# Patient Record
Sex: Female | Born: 1955 | Race: White | Hispanic: No | Smoking: Never smoker
Health system: Southern US, Community
[De-identification: ages and names within clinical notes are randomized; demographics above are authoritative.]

## PROBLEM LIST (undated history)

## (undated) DIAGNOSIS — F32A Depression, unspecified: Secondary | ICD-10-CM

## (undated) DIAGNOSIS — T7840XA Allergy, unspecified, initial encounter: Secondary | ICD-10-CM

## (undated) DIAGNOSIS — F329 Major depressive disorder, single episode, unspecified: Secondary | ICD-10-CM

## (undated) HISTORY — DX: Major depressive disorder, single episode, unspecified: F32.9

## (undated) HISTORY — DX: Allergy, unspecified, initial encounter: T78.40XA

## (undated) HISTORY — DX: Depression, unspecified: F32.A

---

## 2011-03-03 ENCOUNTER — Institutional Professional Consult (permissible substitution): Payer: Self-pay | Admitting: Internal Medicine

## 2012-10-10 ENCOUNTER — Other Ambulatory Visit: Payer: Self-pay | Admitting: Specialist

## 2012-10-16 ENCOUNTER — Ambulatory Visit
Admission: RE | Admit: 2012-10-16 | Discharge: 2012-10-16 | Disposition: A | Discharge status: Home / Self Care | Payer: BC Managed Care – PPO | Source: Ambulatory Visit | Attending: Specialist | Admitting: Specialist

## 2012-10-16 MED ORDER — GADOBENATE DIMEGLUMINE 529 MG/ML IV SOLN
18.0000 mL | Freq: Once | INTRAVENOUS | Status: AC | PRN
  Administered 2012-10-16: 18 mL via INTRAVENOUS

## 2013-04-09 ENCOUNTER — Ambulatory Visit (INDEPENDENT_AMBULATORY_CARE_PROVIDER_SITE_OTHER): Payer: BC Managed Care – PPO | Admitting: Emergency Medicine

## 2013-04-09 VITALS — BP 114/74 | HR 92 | Temp 98.5°F | Resp 18 | Ht 63.75 in | Wt 209.0 lb

## 2013-04-09 DIAGNOSIS — R42 Dizziness and giddiness: Secondary | ICD-10-CM

## 2013-04-09 DIAGNOSIS — H8110 Benign paroxysmal vertigo, unspecified ear: Secondary | ICD-10-CM

## 2013-04-09 LAB — POCT CBC
HCT, POC: 38.2 % (ref 37.7–47.9)
Hemoglobin: 12.4 g/dL (ref 12.2–16.2)
MCH, POC: 29.4 pg (ref 27–31.2)
MPV: 7.7 fL (ref 0–99.8)
POC MID %: 9.1 %M (ref 0–12)
RBC: 4.22 M/uL (ref 4.04–5.48)
WBC: 8.4 10*3/uL (ref 4.6–10.2)

## 2013-04-09 LAB — GLUCOSE, POCT (MANUAL RESULT ENTRY): POC Glucose: 88 mg/dl (ref 70–99)

## 2013-04-09 MED ORDER — DIAZEPAM 2 MG PO TABS: ORAL_TABLET | ORAL | Status: AC

## 2013-04-09 NOTE — Patient Instructions (Signed)
Vertigo Vertigo means you feel like you or your surroundings are moving when they are not. Vertigo can be dangerous if it occurs when you are at work, driving, or performing difficult activities.  CAUSES  Vertigo occurs when there is a conflict of signals sent to your brain from the visual and sensory systems in your body. There are many different causes of vertigo, including:  Infections, especially in the inner ear.  A bad reaction to a drug or misuse of alcohol and medicines.  Withdrawal from drugs or alcohol.  Rapidly changing positions, such as lying down or rolling over in bed.  A migraine headache.  Decreased blood flow to the brain.  Increased pressure in the brain from a head injury, infection, tumor, or bleeding. SYMPTOMS  You may feel as though the world is spinning around or you are falling to the ground. Because your balance is upset, vertigo can cause nausea and vomiting. You may have involuntary eye movements (nystagmus). DIAGNOSIS  Vertigo is usually diagnosed by physical exam. If the cause of your vertigo is unknown, your caregiver may perform imaging tests, such as an MRI scan (magnetic resonance imaging). TREATMENT  Most cases of vertigo resolve on their own, without treatment. Depending on the cause, your caregiver may prescribe certain medicines. If your vertigo is related to body position issues, your caregiver may recommend movements or procedures to correct the problem. In rare cases, if your vertigo is caused by certain inner ear problems, you may need surgery. HOME CARE INSTRUCTIONS   Follow your caregiver's instructions.  Avoid driving.  Avoid operating heavy machinery.  Avoid performing any tasks that would be dangerous to you or others during a vertigo episode.  Tell your caregiver if you notice that certain medicines seem to be causing your vertigo. Some of the medicines used to treat vertigo episodes can actually make them worse in some people. SEEK  IMMEDIATE MEDICAL CARE IF:   Your medicines do not relieve your vertigo or are making it worse.  You develop problems with talking, walking, weakness, or using your arms, hands, or legs.  You develop severe headaches.  Your nausea or vomiting continues or gets worse.  You develop visual changes.  A family member notices behavioral changes.  Your condition gets worse. MAKE SURE YOU:  Understand these instructions.  Will watch your condition.  Will get help right away if you are not doing well or get worse. Document Released: 06/14/2005 Document Revised: 11/27/2011 Document Reviewed: 03/23/2011 [employer] Patient Information 2014 [employer]  

## 2013-04-09 NOTE — Progress Notes (Signed)
  Subjective:    Patient ID: Ana Roy, female    DOB: 06/28/1956, 57 y.o.   MRN: [id number]  HPI patient states she was doing well until about a week ago when she started to have a sensation of motion in her head. She occasionally pitches to one side. She's had a vague nausea that has been persistent. She felt better for a couple of days and now her symptoms have returned. She denies tinnitus. She denies hearing loss. Of note she suffered from severe headaches earlier in the year and had an MRI MRA which showed minimal white matter changes.    Review of Systems     Objective:   Physical Exam patient is alert and cooperative and in no distress. Pupils are equal and reactive to light. Neck is supple. Chest is clear to both auscultation and percussion. Heart is a regular rate without murmurs. Carotids have no bruits. TMs are clear bilaterally Weber testing revealed symmetrical hearing Rinne testing is normal  Results for orders placed in visit on 04/09/13  POCT CBC      Result Value Range   WBC 8.4  4.6 - 10.2 K/uL   Lymph, poc 2.5  0.6 - 3.4   POC LYMPH PERCENT 29.9  10 - 50 %L   MID (cbc) 0.8  0 - 0.9   POC MID % 9.1  0 - 12 %M   POC Granulocyte 5.1  2 - 6.9   Granulocyte percent 61.0  37 - 80 %G   RBC 4.22  4.04 - 5.48 M/uL   Hemoglobin 12.4  12.2 - 16.2 g/dL   HCT, POC 57.8  46.9 - 47.9 %   MCV 90.6  80 - 97 fL   MCH, POC 29.4  27 - 31.2 pg   MCHC 32.5  31.8 - 35.4 g/dL   RDW, POC 62.9     Platelet Count, POC 294  142 - 424 K/uL   MPV 7.7  0 - 99.8 fL  GLUCOSE, POCT (MANUAL RESULT ENTRY)      Result Value Range   POC Glucose 88  70 - 99 mg/dl        Assessment & Plan:  We'll treat as benign positional vertigo. We'll try Valium recheck in 3-4 days if continued symptoms. Referral made to Dr. Dorma Russell.

## 2014-03-02 ENCOUNTER — Ambulatory Visit (INDEPENDENT_AMBULATORY_CARE_PROVIDER_SITE_OTHER): Payer: BC Managed Care – PPO | Admitting: Licensed Clinical Social Worker

## 2014-03-02 DIAGNOSIS — F4322 Adjustment disorder with anxiety: Secondary | ICD-10-CM

## 2014-03-09 ENCOUNTER — Ambulatory Visit (INDEPENDENT_AMBULATORY_CARE_PROVIDER_SITE_OTHER): Payer: BC Managed Care – PPO | Admitting: Licensed Clinical Social Worker

## 2014-03-09 DIAGNOSIS — F4322 Adjustment disorder with anxiety: Secondary | ICD-10-CM

## 2014-03-16 ENCOUNTER — Ambulatory Visit (INDEPENDENT_AMBULATORY_CARE_PROVIDER_SITE_OTHER): Payer: BC Managed Care – PPO | Admitting: Licensed Clinical Social Worker

## 2014-03-16 DIAGNOSIS — F4322 Adjustment disorder with anxiety: Secondary | ICD-10-CM

## 2014-12-16 ENCOUNTER — Ambulatory Visit (INDEPENDENT_AMBULATORY_CARE_PROVIDER_SITE_OTHER): Payer: BC Managed Care – PPO | Admitting: Licensed Clinical Social Worker

## 2014-12-16 DIAGNOSIS — F4322 Adjustment disorder with anxiety: Secondary | ICD-10-CM | POA: Diagnosis not present

## 2015-03-31 ENCOUNTER — Ambulatory Visit (INDEPENDENT_AMBULATORY_CARE_PROVIDER_SITE_OTHER): Payer: BC Managed Care – PPO | Admitting: Licensed Clinical Social Worker

## 2015-03-31 DIAGNOSIS — F4322 Adjustment disorder with anxiety: Secondary | ICD-10-CM | POA: Diagnosis not present

## 2015-05-21 ENCOUNTER — Ambulatory Visit: Payer: BC Managed Care – PPO | Admitting: Licensed Clinical Social Worker

## 2015-05-31 ENCOUNTER — Ambulatory Visit (INDEPENDENT_AMBULATORY_CARE_PROVIDER_SITE_OTHER): Payer: BC Managed Care – PPO | Admitting: Licensed Clinical Social Worker

## 2015-05-31 DIAGNOSIS — F4322 Adjustment disorder with anxiety: Secondary | ICD-10-CM

## 2015-12-06 ENCOUNTER — Telehealth: Payer: Self-pay | Admitting: Internal Medicine

## 2015-12-07 NOTE — Telephone Encounter (Signed)
I am slowing down and unable to help her with this problem now. Suggest she see one of the other allergy practices in town.

## 2015-12-07 NOTE — Telephone Encounter (Signed)
Spoke with the pt and notified of recs per CDY  I gave her [facility] number  Nothing further needed

## 2015-12-07 NOTE — Telephone Encounter (Signed)
CY - please advise. Thanks! 

## 2016-12-06 ENCOUNTER — Other Ambulatory Visit: Payer: Self-pay | Admitting: Family Medicine

## 2016-12-06 ENCOUNTER — Other Ambulatory Visit (HOSPITAL_COMMUNITY)
Admission: RE | Admit: 2016-12-06 | Discharge: 2016-12-06 | Disposition: A | Discharge status: Home / Self Care | Payer: BC Managed Care – PPO | Source: Ambulatory Visit | Attending: Family Medicine | Admitting: Family Medicine

## 2016-12-06 DIAGNOSIS — Z124 Encounter for screening for malignant neoplasm of cervix: Secondary | ICD-10-CM | POA: Insufficient documentation

## 2016-12-06 DIAGNOSIS — Z1151 Encounter for screening for human papillomavirus (HPV): Secondary | ICD-10-CM | POA: Diagnosis present

## 2016-12-07 ENCOUNTER — Other Ambulatory Visit: Payer: Self-pay | Admitting: Family Medicine

## 2016-12-07 DIAGNOSIS — Z1231 Encounter for screening mammogram for malignant neoplasm of breast: Secondary | ICD-10-CM

## 2016-12-08 LAB — CYTOLOGY - PAP
DIAGNOSIS: NEGATIVE
HPV: NOT DETECTED

## 2017-01-03 ENCOUNTER — Ambulatory Visit
Admission: RE | Admit: 2017-01-03 | Discharge: 2017-01-03 | Disposition: A | Discharge status: Home / Self Care | Payer: BC Managed Care – PPO | Source: Ambulatory Visit | Attending: Family Medicine | Admitting: Family Medicine

## 2017-01-03 DIAGNOSIS — Z1231 Encounter for screening mammogram for malignant neoplasm of breast: Secondary | ICD-10-CM

## 2019-11-10 ENCOUNTER — Other Ambulatory Visit: Payer: Self-pay | Admitting: Family Medicine

## 2019-11-10 DIAGNOSIS — M8580 Other specified disorders of bone density and structure, unspecified site: Secondary | ICD-10-CM

## 2019-11-11 ENCOUNTER — Other Ambulatory Visit: Payer: Self-pay | Admitting: Family Medicine

## 2019-11-11 DIAGNOSIS — Z1231 Encounter for screening mammogram for malignant neoplasm of breast: Secondary | ICD-10-CM

## 2020-01-27 ENCOUNTER — Other Ambulatory Visit: Payer: Self-pay

## 2020-01-27 ENCOUNTER — Ambulatory Visit
Admission: RE | Admit: 2020-01-27 | Discharge: 2020-01-27 | Disposition: A | Discharge status: Home / Self Care | Payer: BC Managed Care – PPO | Source: Ambulatory Visit | Attending: Family Medicine | Admitting: Family Medicine

## 2020-01-27 DIAGNOSIS — M8580 Other specified disorders of bone density and structure, unspecified site: Secondary | ICD-10-CM

## 2020-01-27 DIAGNOSIS — Z1231 Encounter for screening mammogram for malignant neoplasm of breast: Secondary | ICD-10-CM

## 2020-12-27 IMAGING — MG DIGITAL SCREENING BILAT W/ CAD
5 series · 5 of 5 positions shown · non-contrast
Comparison: Previous exam(s).

CLINICAL DATA: Screening.

EXAM:
DIGITAL SCREENING BILATERAL MAMMOGRAM WITH CAD

[R CC]
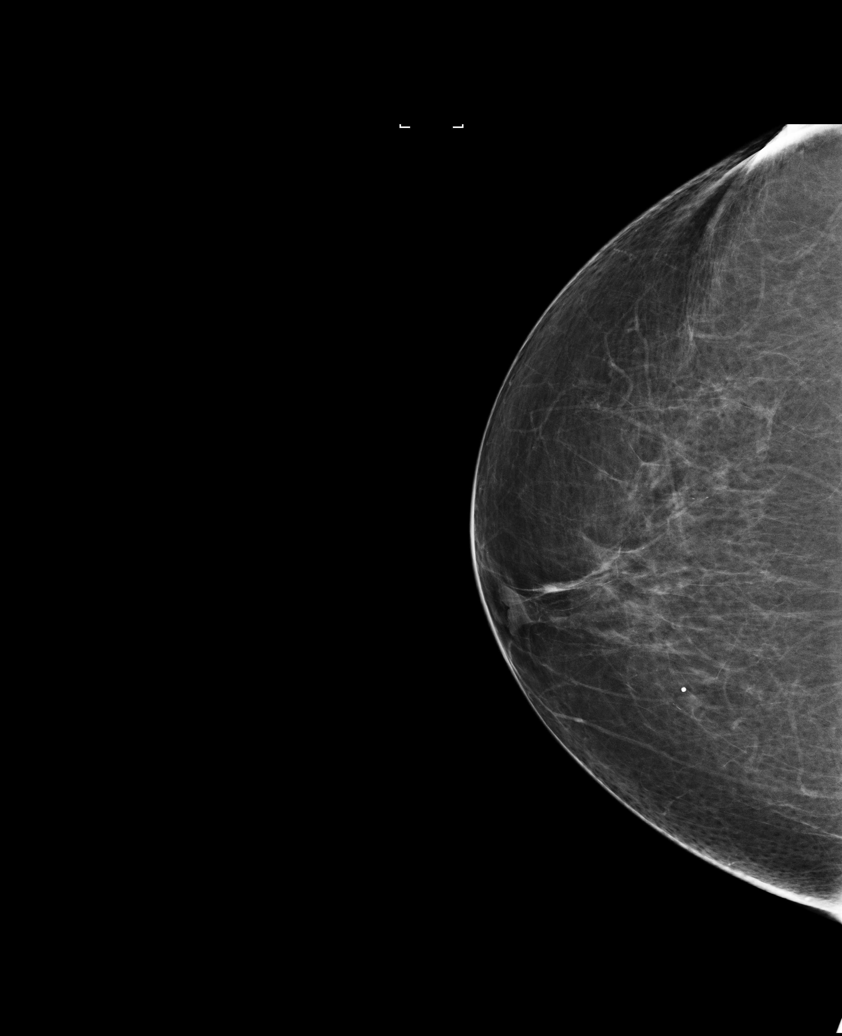

[L MLO]
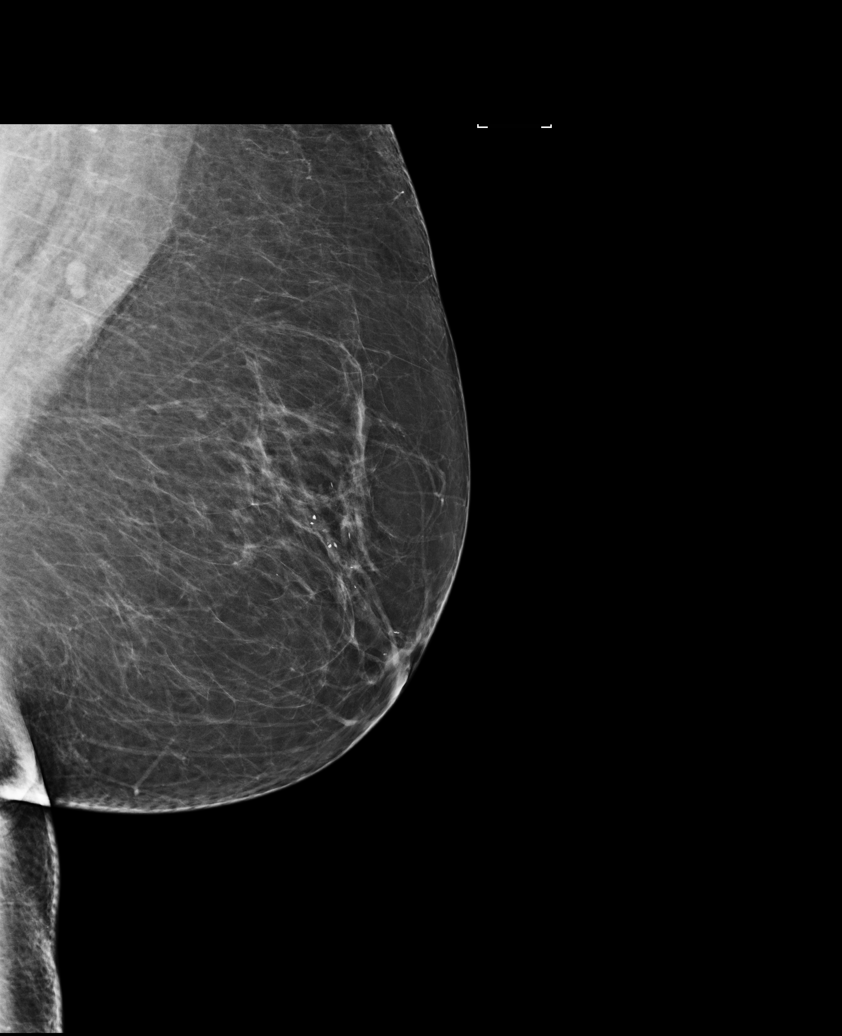

[L CC]
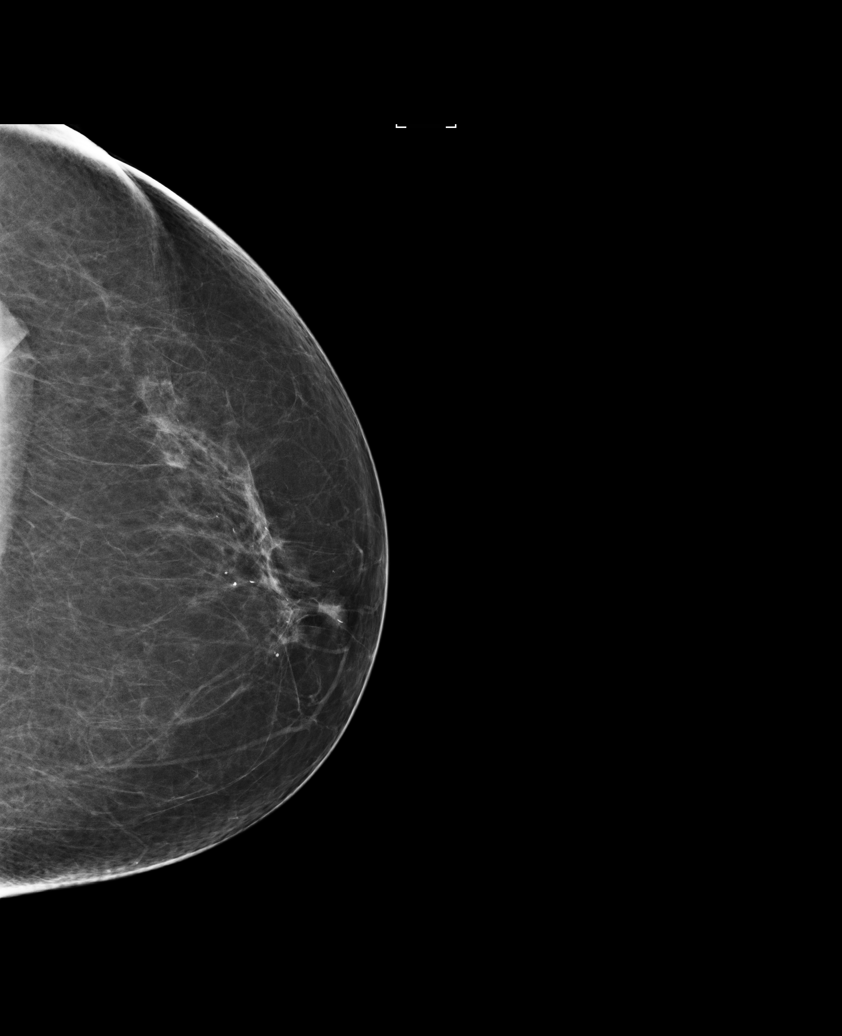

[R MLO (1 of 2)]
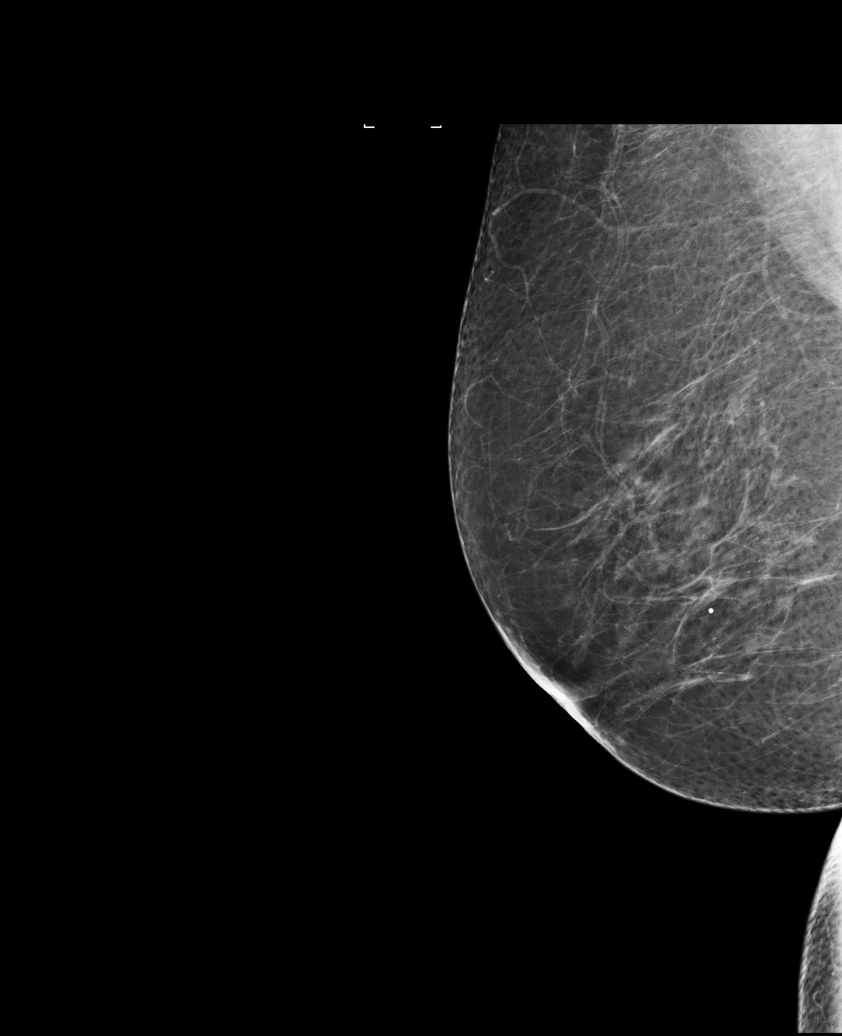

[R MLO (2 of 2)]
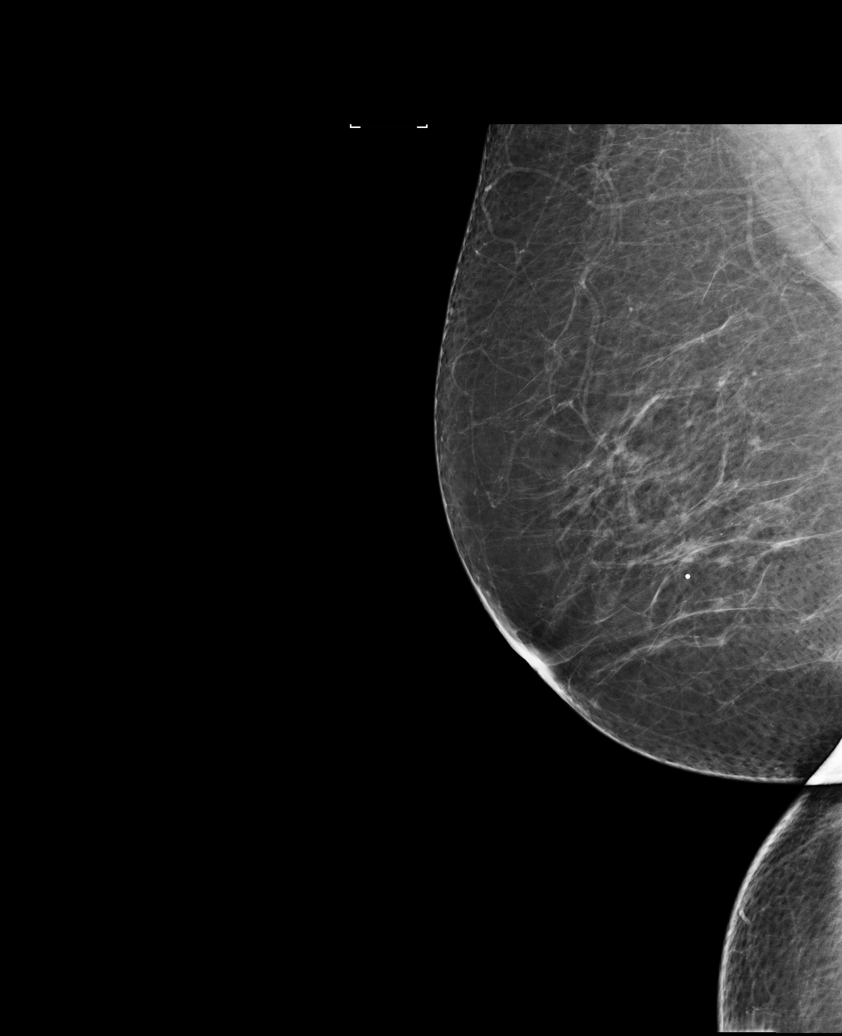

[5 of 5 positions shown; findings below may reference images not displayed]

ACR Breast Density Category b: There are scattered areas of
fibroglandular density.
FINDINGS: There are no findings suspicious for malignancy. Images were
processed with CAD.
IMPRESSION: No mammographic evidence of malignancy. A result letter of this
screening mammogram will be mailed directly to the patient.

RECOMMENDATION:
Screening mammogram in one year. (Code:AS-G-LCT)

BI-RADS CATEGORY  1: Negative.
